# Patient Record
Sex: Male | Born: 2000 | Race: White | Hispanic: No | Marital: Single | Smoking: Never smoker
Health system: Southern US, Community
[De-identification: ages and names within clinical notes are randomized; demographics above are authoritative.]

## PROBLEM LIST (undated history)

## (undated) DIAGNOSIS — K900 Celiac disease: Secondary | ICD-10-CM

## (undated) HISTORY — PX: ABDOMINAL SURGERY: SHX537

---
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
